# Patient Record
Sex: Male | Born: 1990 | Race: Black or African American | Hispanic: No | Smoking: Never smoker
Health system: Southern US, Community
[De-identification: ages and names within clinical notes are randomized; demographics above are authoritative.]

---

## 2017-02-18 ENCOUNTER — Emergency Department (HOSPITAL_COMMUNITY)
Admission: EM | Admit: 2017-02-18 | Discharge: 2017-02-18 | Disposition: A | Discharge status: Home / Self Care | Payer: Self-pay | Attending: Emergency Medicine | Admitting: Emergency Medicine

## 2017-02-18 ENCOUNTER — Other Ambulatory Visit: Payer: Self-pay

## 2017-02-18 ENCOUNTER — Emergency Department (HOSPITAL_COMMUNITY): Payer: Self-pay

## 2017-02-18 ENCOUNTER — Encounter (HOSPITAL_COMMUNITY): Payer: Self-pay | Admitting: Emergency Medicine

## 2017-02-18 DIAGNOSIS — K279 Peptic ulcer, site unspecified, unspecified as acute or chronic, without hemorrhage or perforation: Secondary | ICD-10-CM | POA: Insufficient documentation

## 2017-02-18 LAB — URINALYSIS, ROUTINE W REFLEX MICROSCOPIC
BILIRUBIN URINE: NEGATIVE
GLUCOSE, UA: NEGATIVE mg/dL
HGB URINE DIPSTICK: NEGATIVE
KETONES UR: NEGATIVE mg/dL
Leukocytes, UA: NEGATIVE
NITRITE: NEGATIVE
PH: 6 (ref 5.0–8.0)
Protein, ur: NEGATIVE mg/dL
SPECIFIC GRAVITY, URINE: 1.013 (ref 1.005–1.030)

## 2017-02-18 LAB — CBC
HCT: 42.2 % (ref 39.0–52.0)
Hemoglobin: 14.5 g/dL (ref 13.0–17.0)
MCH: 26.4 pg (ref 26.0–34.0)
MCHC: 34.4 g/dL (ref 30.0–36.0)
MCV: 76.7 fL — AB (ref 78.0–100.0)
PLATELETS: 209 10*3/uL (ref 150–400)
RBC: 5.5 MIL/uL (ref 4.22–5.81)
RDW: 12.5 % (ref 11.5–15.5)
WBC: 8 10*3/uL (ref 4.0–10.5)

## 2017-02-18 LAB — BASIC METABOLIC PANEL
Anion gap: 8 (ref 5–15)
BUN: 17 mg/dL (ref 6–20)
CALCIUM: 9.5 mg/dL (ref 8.9–10.3)
CO2: 27 mmol/L (ref 22–32)
CREATININE: 1.18 mg/dL (ref 0.61–1.24)
Chloride: 102 mmol/L (ref 101–111)
GFR calc non Af Amer: >60 mL/min (ref >60)
Glucose, Bld: 79 mg/dL (ref 65–99)
Potassium: 4.1 mmol/L (ref 3.5–5.1)
SODIUM: 137 mmol/L (ref 135–145)

## 2017-02-18 LAB — HEPATIC FUNCTION PANEL
ALBUMIN: 4.5 g/dL (ref 3.5–5.0)
ALK PHOS: 61 U/L (ref 38–126)
ALT: 41 U/L (ref 17–63)
AST: 29 U/L (ref 15–41)
BILIRUBIN DIRECT: 0.1 mg/dL (ref 0.1–0.5)
BILIRUBIN INDIRECT: 1.1 mg/dL — AB (ref 0.3–0.9)
Total Bilirubin: 1.2 mg/dL (ref 0.3–1.2)
Total Protein: 6.9 g/dL (ref 6.5–8.1)

## 2017-02-18 LAB — I-STAT TROPONIN, ED: TROPONIN I, POC: 0 ng/mL (ref 0.00–0.08)

## 2017-02-18 LAB — LIPASE, BLOOD: LIPASE: 37 U/L (ref 11–51)

## 2017-02-18 MED ORDER — GI COCKTAIL ~~LOC~~
30.0000 mL | Freq: Once | ORAL | Status: AC
  Administered 2017-02-18: 30 mL via ORAL
  Filled 2017-02-18: qty 30

## 2017-02-18 MED ORDER — SUCRALFATE 1 GM/10ML PO SUSP: 1.0000 g | Freq: Three times a day (TID) | ORAL | 0 refills | Status: DC

## 2017-02-18 MED ORDER — OMEPRAZOLE 20 MG PO CPDR: 20.0000 mg | DELAYED_RELEASE_CAPSULE | Freq: Every day | ORAL | 0 refills | Status: DC

## 2017-02-18 NOTE — ED Triage Notes (Signed)
Patient arrives with complaint of abdominal pain. States onset initially months ago and it was mild, intermittent, and only appeared after sleeping. Over the past 2 weeks he explains that pain has become constant and more intense. Initially pain was only in lower medial area of abdomen. Now pain is diffuse over entire abdomen and extending to mid chest.

## 2017-02-18 NOTE — ED Provider Notes (Signed)
[facility] Provider Note   CSN: [id number] Arrival date & time: 02/18/17  1949     History   Chief Complaint Chief Complaint  Patient presents with  . Abdominal Pain    HPI Mike Madden is a 26 y.o. male.  Patient presents to the ED with a chief complaint of abdominal pain.  He states that he has had the symptoms for several months.  He reports that he has had some epigastric pain, which is worsened with eating.  He reports that it is improved with gas-x and antacids.  He also states that he has been having some constipation.  He states that the symptoms started when he was graduating from college and was under a lot of stress about finding a job.  He denies any fevers, or chills.  There are no other associated symptoms or modifying factors.  He has used a laxative for the constipation with some relief.   The history is provided by the patient. No language interpreter was used.    History reviewed. No pertinent past medical history.  There are no active problems to display for this patient.   History reviewed. No pertinent surgical history.     Home Medications    Prior to Admission medications   Not on File    Family History History reviewed. No pertinent family history.  Social History Social History  Substance Use Topics  . Smoking status: Never Smoker  . Smokeless tobacco: Never Used  . Alcohol use No     Allergies   Patient has no known allergies.   Review of Systems Review of Systems  All other systems reviewed and are negative.    Physical Exam Updated Vital Signs BP 128/84   Pulse 64   Temp (!) 97.5 F (36.4 C) (Oral)   Resp 16   Ht 6\' 2"  (1.88 m)   Wt 77.1 kg (170 lb)   SpO2 100%   BMI 21.83 kg/m   Physical Exam  Constitutional: He is oriented to person, place, and time. He appears well-developed and well-nourished.  HENT:  Head: Normocephalic and atraumatic.  Eyes: Pupils are equal, round, and reactive to light.  Conjunctivae and EOM are normal. Right eye exhibits no discharge. Left eye exhibits no discharge. No scleral icterus.  Neck: Normal range of motion. Neck supple. No JVD present.  Cardiovascular: Normal rate, regular rhythm and normal heart sounds.  Exam reveals no gallop and no friction rub.   No murmur heard. Pulmonary/Chest: Effort normal and breath sounds normal. No respiratory distress. He has no wheezes. He has no rales. He exhibits no tenderness.  Abdominal: Soft. He exhibits no distension and no mass. There is no tenderness. There is no rebound and no guarding.  No focal abdominal tenderness, no RLQ tenderness or pain at McBurney's Madden, no RUQ tenderness or Murphy's sign, no left-sided abdominal tenderness, no fluid wave, or signs of peritonitis   Musculoskeletal: Normal range of motion. He exhibits no edema or tenderness.  Neurological: He is alert and oriented to person, place, and time.  Skin: Skin is warm and dry.  Psychiatric: He has a normal mood and affect. His behavior is normal. Judgment and thought content normal.  Nursing note and vitals reviewed.    ED Treatments / Results  Labs (all labs ordered are listed, but only abnormal results are displayed) Labs Reviewed  CBC - Abnormal; Notable for the following:       Result Value   MCV 76.7 (*)  All other components within normal limits  BASIC METABOLIC PANEL  URINALYSIS, ROUTINE W REFLEX MICROSCOPIC  HEPATIC FUNCTION PANEL  LIPASE, BLOOD  I-STAT TROPONIN, ED    EKG  EKG Interpretation None       Radiology Dg Chest 2 View  Result Date: 02/18/2017 CLINICAL DATA:  Abdominal pain EXAM: CHEST  2 VIEW COMPARISON:  None. FINDINGS: The heart size and mediastinal contours are within normal limits. Both lungs are clear. The visualized skeletal structures are unremarkable. IMPRESSION: No active cardiopulmonary disease. Electronically Signed   By: Jasmine Pang M.D.   On: 02/18/2017 20:56    Procedures Procedures  (including critical care time)  Medications Ordered in ED Medications  gi cocktail (Maalox,Lidocaine,Donnatal) (30 mLs Oral Given 02/18/17 2310)     Initial Impression / Assessment and Plan / ED Course  I have reviewed the triage vital signs and the nursing notes.  Pertinent labs & imaging results that were available during my care of the patient were reviewed by me and considered in my medical decision making (see chart for details).     Patient with epigastric abdominal pain.  Worsened with eating.  Ongoing for several months.  No fever.  Also reports some constipation.   Labs are reassuring.  Symptoms seem consistent with PUD.  I highly doubt surgical abdomen based on story and appearance.  He has not focal abdominal tenderness.  Will treat with omeprazole and carafate.  Recommend GI follow-up.  Other etiologies such as IBS may need to be considered and worked up on outpatient basis.   Patient understands and agrees with the plan.  Final Clinical Impressions(s) / ED Diagnoses   Final diagnoses:  PUD (peptic ulcer disease)    New Prescriptions New Prescriptions   OMEPRAZOLE (PRILOSEC) 20 MG CAPSULE    Take 1 capsule (20 mg total) by mouth daily.   SUCRALFATE (CARAFATE) 1 GM/10ML SUSPENSION    Take 10 mLs (1 g total) by mouth 4 (four) times daily -  with meals and at bedtime.     Roxy Horseman, PA-C 02/18/17 Adin Hector    Blane Ohara, MD 02/19/17 [phone]

## 2017-03-02 ENCOUNTER — Telehealth: Payer: Self-pay | Admitting: Surgery

## 2017-03-02 NOTE — Telephone Encounter (Signed)
ED CM received call from patient concerning changing medications from suspension to tablet form, because he is unable to afford the cost. CM discussed with Dr. Azalia BilisKevin Campos. Prescription called into [facility] on [address] [phone] at patient's request. Patient notified No further questions or concerns.

## 2017-09-06 ENCOUNTER — Other Ambulatory Visit: Payer: Self-pay | Admitting: Physician Assistant

## 2017-09-06 DIAGNOSIS — R1084 Generalized abdominal pain: Secondary | ICD-10-CM

## 2017-09-13 ENCOUNTER — Ambulatory Visit
Admission: RE | Admit: 2017-09-13 | Discharge: 2017-09-13 | Disposition: A | Discharge status: Home / Self Care | Payer: Self-pay | Source: Ambulatory Visit | Attending: Physician Assistant | Admitting: Physician Assistant

## 2017-09-13 DIAGNOSIS — R1084 Generalized abdominal pain: Secondary | ICD-10-CM

## 2017-09-14 ENCOUNTER — Other Ambulatory Visit (HOSPITAL_COMMUNITY): Payer: Self-pay | Admitting: Gastroenterology

## 2017-09-14 DIAGNOSIS — R1012 Left upper quadrant pain: Secondary | ICD-10-CM

## 2017-09-18 ENCOUNTER — Ambulatory Visit (HOSPITAL_COMMUNITY)
Admission: RE | Admit: 2017-09-18 | Discharge: 2017-09-18 | Disposition: A | Discharge status: Home / Self Care | Payer: BC Managed Care – PPO | Source: Ambulatory Visit | Attending: Gastroenterology | Admitting: Gastroenterology

## 2017-09-18 ENCOUNTER — Encounter (HOSPITAL_COMMUNITY): Payer: Self-pay

## 2017-09-18 DIAGNOSIS — R1012 Left upper quadrant pain: Secondary | ICD-10-CM | POA: Insufficient documentation

## 2017-09-18 MED ORDER — IOPAMIDOL (ISOVUE-300) INJECTION 61%
INTRAVENOUS | Status: AC
  Filled 2017-09-18: qty 100

## 2017-09-22 ENCOUNTER — Ambulatory Visit (HOSPITAL_COMMUNITY): Payer: BC Managed Care – PPO

## 2019-02-03 IMAGING — US US ABDOMEN COMPLETE
1 series · 14 of 25 positions shown · non-contrast
Comparison: None.

CLINICAL DATA: Worsening right upper quadrant pain over the past 2
years.

EXAM:
ABDOMEN ULTRASOUND COMPLETE

[Series 1: us abdomen complete · 0.19mm/px · 14 of 91 slices shown]
[im 1/91]
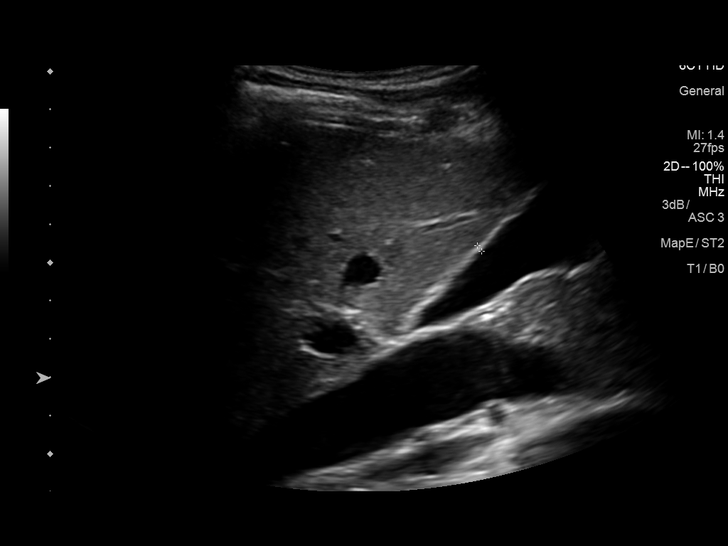
[im 8/91]
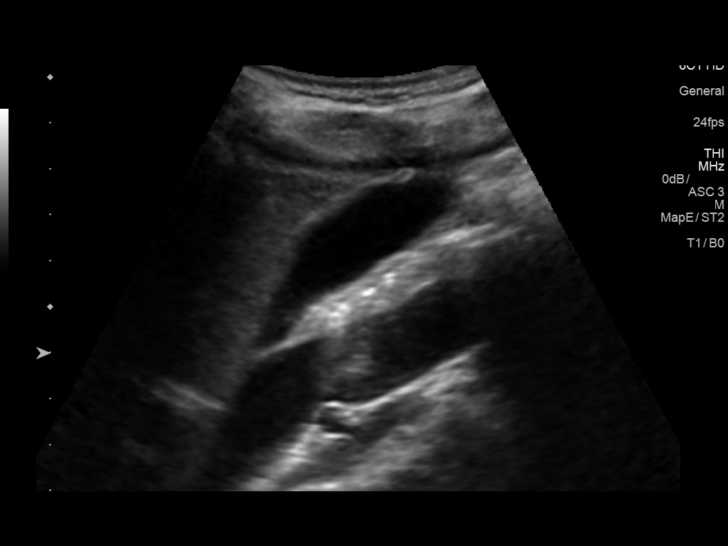
[im 16/91]
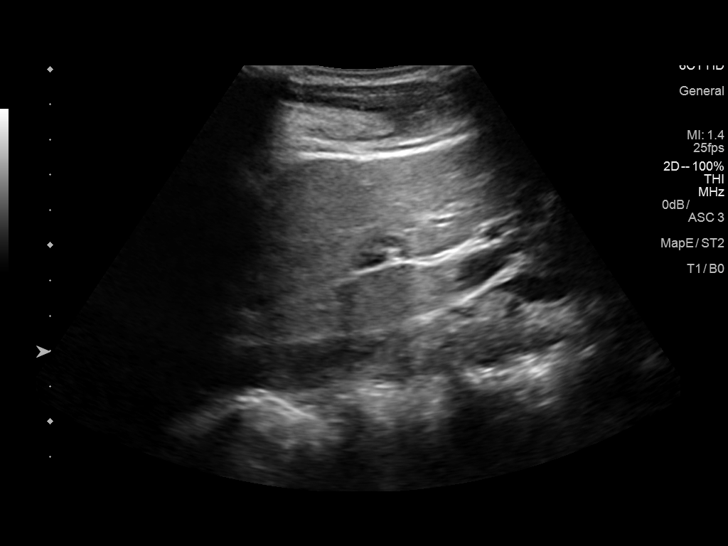
[im 23/91]
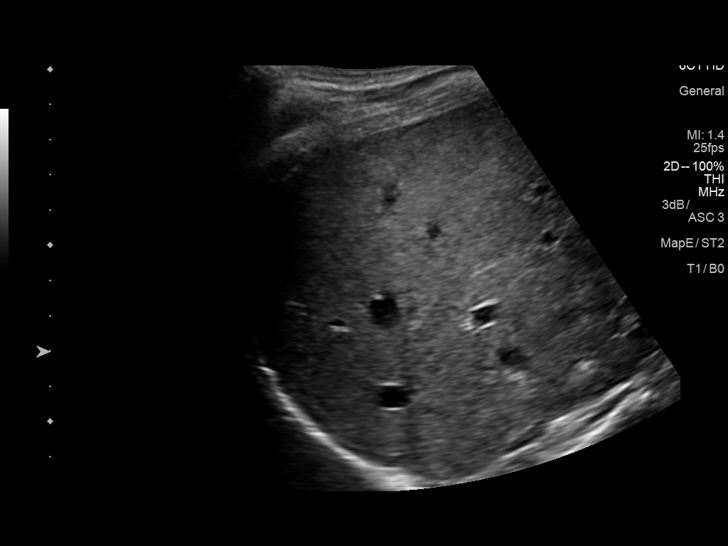
[im 31/91]
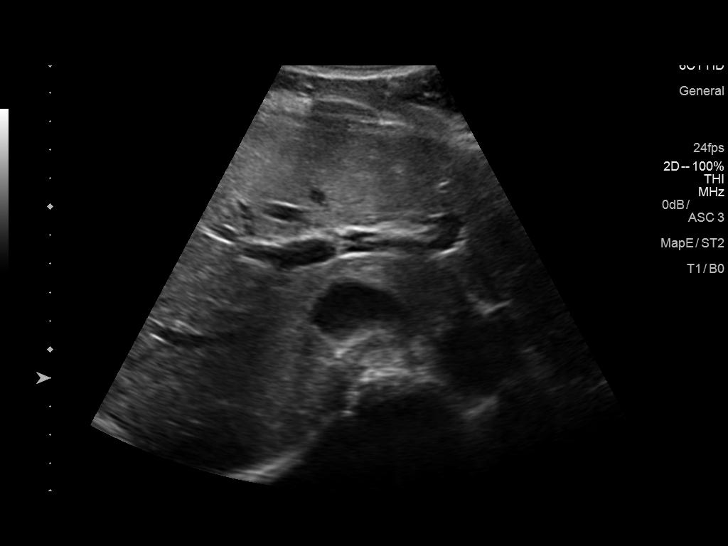
[im 34/91]
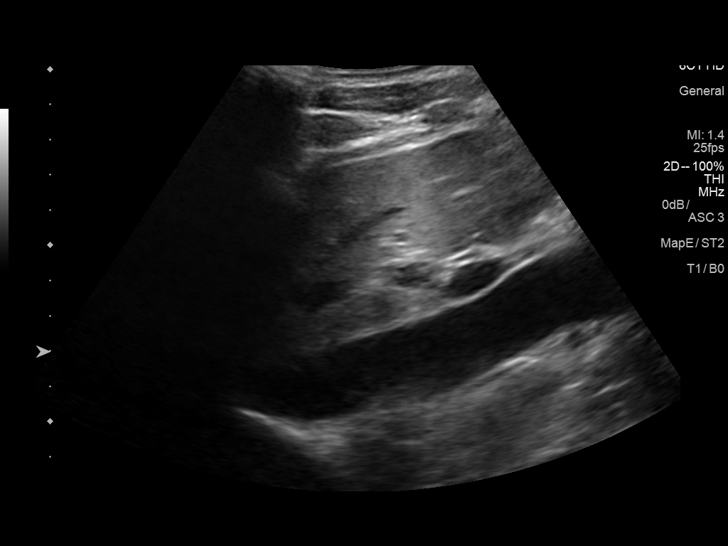
[im 42/91]
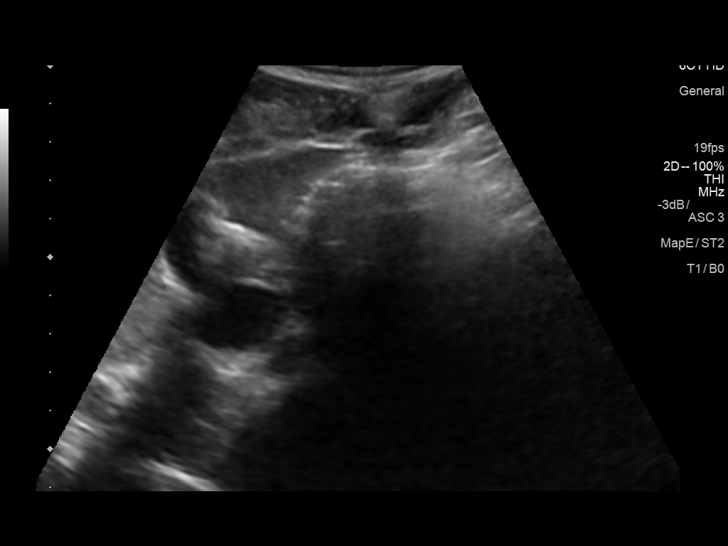
[im 49/91]
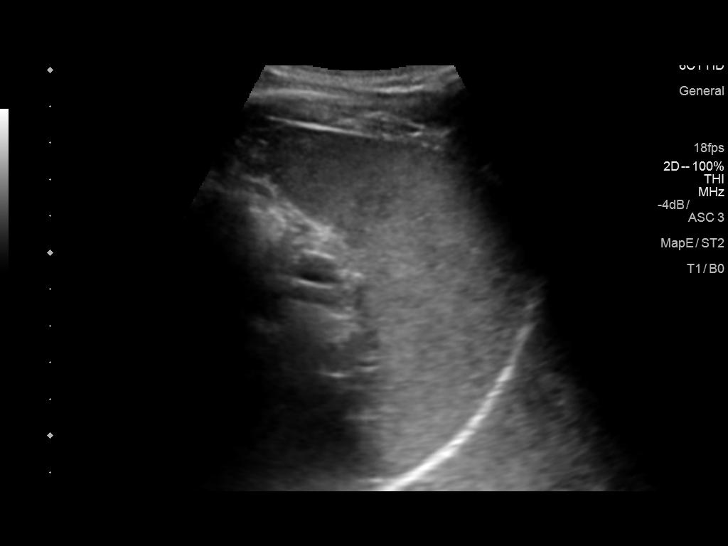
[im 57/91]
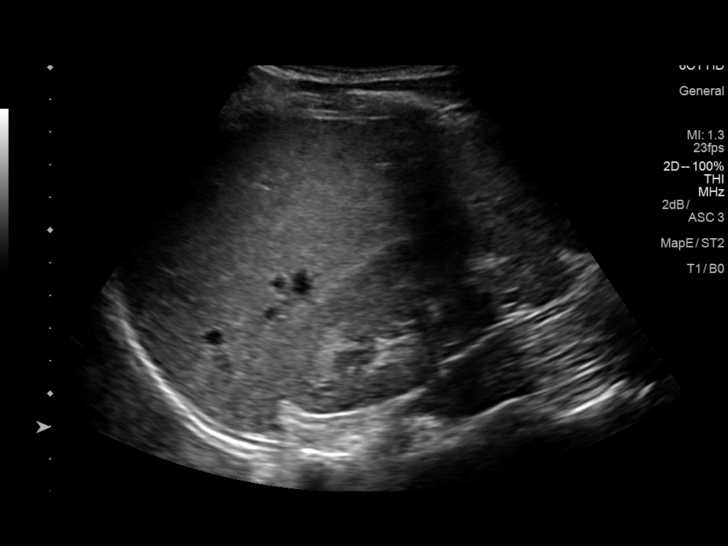
[im 61/91]
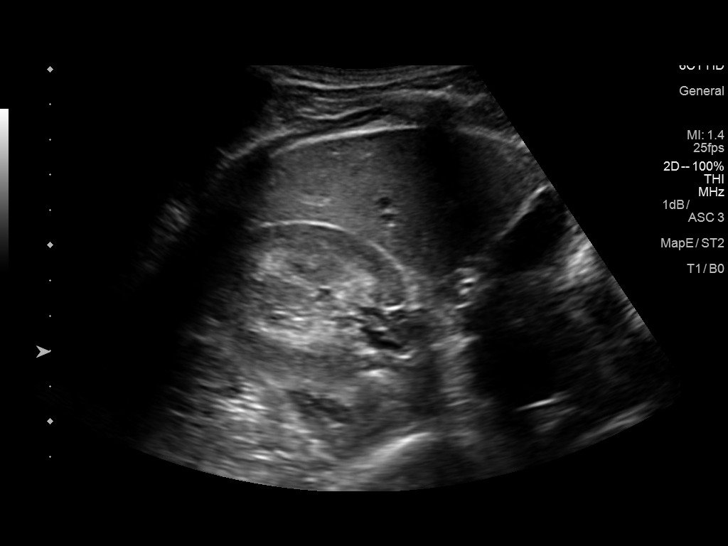
[im 68/91]
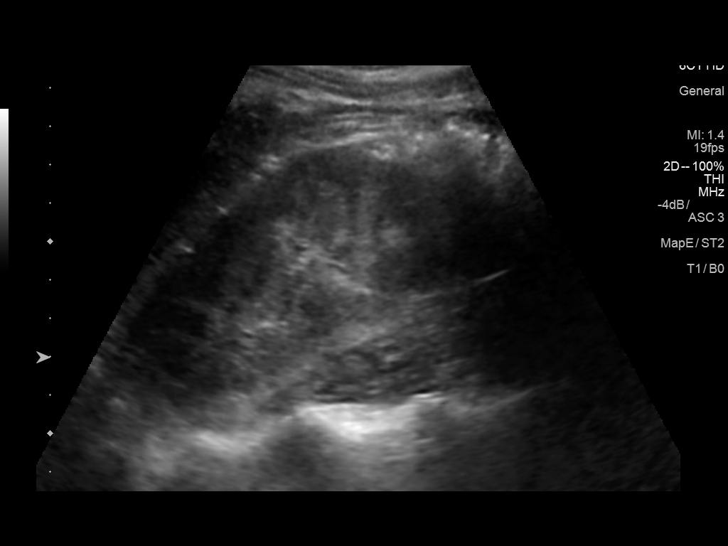
[im 76/91]
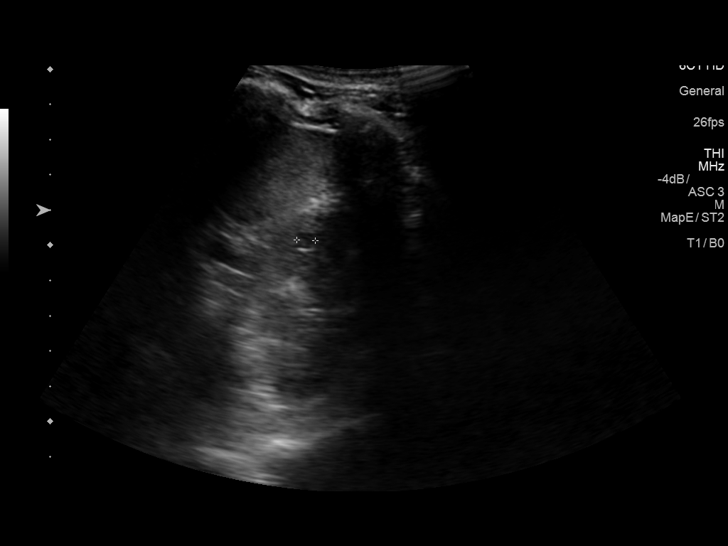
[im 83/91]
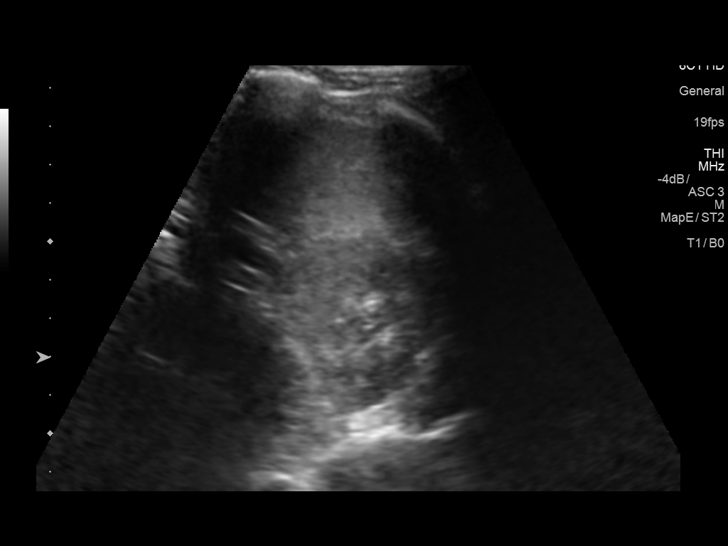
[im 91/91]
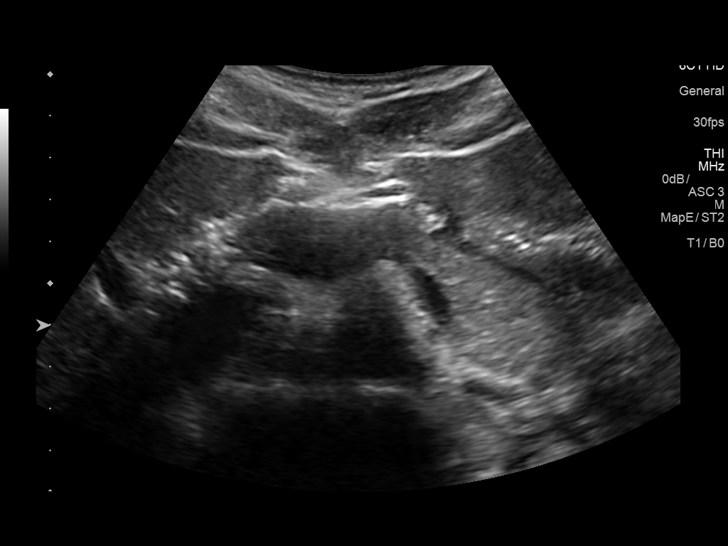

[14 of 25 positions shown; findings below may reference images not displayed]

FINDINGS: Gallbladder: No gallstones or wall thickening visualized. No
sonographic Murphy sign noted by sonographer.

Common bile duct: Diameter: 0.2 cm

Liver: No focal lesion identified. Within normal limits in
parenchymal echogenicity. Portal vein is patent on color Doppler
imaging with normal direction of blood flow towards the liver.

IVC: No abnormality visualized.

Pancreas: Visualized portion unremarkable.

Spleen: Size and appearance within normal limits.

Right Kidney: Length: 10.7 cm. Echogenicity within normal limits. No
mass or hydronephrosis visualized.

Left Kidney: Length: 10.9 cm. Echogenicity within normal limits.
cm cyst left kidney noted. No hydronephrosis visualized.

Abdominal aorta: No aneurysm visualized.

Other findings: None.
IMPRESSION: Normal exam.

## 2020-03-02 ENCOUNTER — Encounter (HOSPITAL_COMMUNITY): Payer: Self-pay | Admitting: *Deleted

## 2020-03-02 ENCOUNTER — Other Ambulatory Visit: Payer: Self-pay

## 2020-03-02 ENCOUNTER — Emergency Department (HOSPITAL_COMMUNITY)
Admission: EM | Admit: 2020-03-02 | Discharge: 2020-03-02 | Disposition: A | Discharge status: Home / Self Care | Payer: BC Managed Care – PPO | Attending: Emergency Medicine | Admitting: Emergency Medicine

## 2020-03-02 DIAGNOSIS — R0602 Shortness of breath: Secondary | ICD-10-CM | POA: Insufficient documentation

## 2020-03-02 DIAGNOSIS — Z5321 Procedure and treatment not carried out due to patient leaving prior to being seen by health care provider: Secondary | ICD-10-CM | POA: Insufficient documentation

## 2020-03-02 DIAGNOSIS — R109 Unspecified abdominal pain: Secondary | ICD-10-CM | POA: Insufficient documentation

## 2020-03-02 LAB — COMPREHENSIVE METABOLIC PANEL
ALT: 40 U/L (ref 0–44)
AST: 24 U/L (ref 15–41)
Albumin: 4.2 g/dL (ref 3.5–5.0)
Alkaline Phosphatase: 55 U/L (ref 38–126)
Anion gap: 8 (ref 5–15)
BUN: 13 mg/dL (ref 6–20)
CO2: 27 mmol/L (ref 22–32)
Calcium: 9.4 mg/dL (ref 8.9–10.3)
Chloride: 103 mmol/L (ref 98–111)
Creatinine, Ser: 1.07 mg/dL (ref 0.61–1.24)
GFR calc Af Amer: >60 mL/min (ref >60)
GFR calc non Af Amer: >60 mL/min (ref >60)
Glucose, Bld: 110 mg/dL — ABNORMAL HIGH (ref 70–99)
Potassium: 3.7 mmol/L (ref 3.5–5.1)
Sodium: 138 mmol/L (ref 135–145)
Total Bilirubin: 1 mg/dL (ref 0.3–1.2)
Total Protein: 6.8 g/dL (ref 6.5–8.1)

## 2020-03-02 LAB — URINALYSIS, ROUTINE W REFLEX MICROSCOPIC
Bilirubin Urine: NEGATIVE
Glucose, UA: NEGATIVE mg/dL
Hgb urine dipstick: NEGATIVE
Ketones, ur: NEGATIVE mg/dL
Leukocytes,Ua: NEGATIVE
Nitrite: NEGATIVE
Protein, ur: NEGATIVE mg/dL
Specific Gravity, Urine: 1.008 (ref 1.005–1.030)
pH: 6 (ref 5.0–8.0)

## 2020-03-02 LAB — CBC
HCT: 46 % (ref 39.0–52.0)
Hemoglobin: 14.9 g/dL (ref 13.0–17.0)
MCH: 25.7 pg — ABNORMAL LOW (ref 26.0–34.0)
MCHC: 32.4 g/dL (ref 30.0–36.0)
MCV: 79.3 fL — ABNORMAL LOW (ref 80.0–100.0)
Platelets: 214 10*3/uL (ref 150–400)
RBC: 5.8 MIL/uL (ref 4.22–5.81)
RDW: 12.7 % (ref 11.5–15.5)
WBC: 9.3 10*3/uL (ref 4.0–10.5)
nRBC: 0 % (ref 0.0–0.2)

## 2020-03-02 LAB — LIPASE, BLOOD: Lipase: 30 U/L (ref 11–51)

## 2020-03-02 MED ORDER — SODIUM CHLORIDE 0.9% FLUSH: 3.0000 mL | Freq: Once | INTRAVENOUS | Status: DC

## 2020-03-02 NOTE — ED Triage Notes (Signed)
The pt is c/o abd pain  For one week he has some sob

## 2020-03-02 NOTE — ED Notes (Signed)
Pt called x 3  No answer. 

## 2020-04-27 ENCOUNTER — Other Ambulatory Visit: Payer: Self-pay

## 2020-04-27 ENCOUNTER — Ambulatory Visit (HOSPITAL_COMMUNITY)
Admission: EM | Admit: 2020-04-27 | Discharge: 2020-04-27 | Disposition: A | Discharge status: Home / Self Care | Payer: HRSA Program | Attending: Family Medicine | Admitting: Family Medicine

## 2020-04-27 ENCOUNTER — Encounter (HOSPITAL_COMMUNITY): Payer: Self-pay | Admitting: Family Medicine

## 2020-04-27 DIAGNOSIS — B349 Viral infection, unspecified: Secondary | ICD-10-CM | POA: Insufficient documentation

## 2020-04-27 DIAGNOSIS — R6883 Chills (without fever): Secondary | ICD-10-CM | POA: Insufficient documentation

## 2020-04-27 DIAGNOSIS — U071 COVID-19: Secondary | ICD-10-CM | POA: Diagnosis not present

## 2020-04-27 DIAGNOSIS — R109 Unspecified abdominal pain: Secondary | ICD-10-CM | POA: Diagnosis not present

## 2020-04-27 DIAGNOSIS — Z79899 Other long term (current) drug therapy: Secondary | ICD-10-CM | POA: Insufficient documentation

## 2020-04-27 DIAGNOSIS — K219 Gastro-esophageal reflux disease without esophagitis: Secondary | ICD-10-CM | POA: Insufficient documentation

## 2020-04-27 MED ORDER — ONDANSETRON 4 MG PO TBDP: 4.0000 mg | ORAL_TABLET | Freq: Three times a day (TID) | ORAL | 0 refills | Status: AC | PRN

## 2020-04-27 MED ORDER — FAMOTIDINE 20 MG PO TABS: 20.0000 mg | ORAL_TABLET | Freq: Two times a day (BID) | ORAL | 0 refills | Status: DC

## 2020-04-27 NOTE — ED Triage Notes (Signed)
PT reports Sx's started on 04-24-20 and have progressed. Pt has Nausea, ABD pain across ABD not just one spot. Pt also has a cough ,chills.

## 2020-04-27 NOTE — Discharge Instructions (Addendum)
Medications as prescribed as needed Liquid mucinex Tylenol as needed Rest, hydrate with Gatorade and Pedialyte.  Chicken and bone broth.  Advance diet as tolerated.  Check my chart for covid results.

## 2020-04-27 NOTE — ED Provider Notes (Signed)
[facility]    CSN: [id number] Arrival date & time: 04/27/20  7902      History   Chief Complaint Chief Complaint  Patient presents with  . Chills  . Abdominal Pain    HPI Mike Madden is a 29 y.o. male.   Patient is I am 29 year old male who presents today with approximately 3 to 4 days of nausea, abdominal pain, loss of appetite, diarrhea, cough, chills, nasal congestion.  Symptoms been constant, waxing waning.  Has been taking Mucinex.  History of GERD.  Denies any chest pain or shortness of breath.  Denies any vomiting.  Best friend recently sick with similar symptoms.  [facility]ab [facility] 2 days ago and has not yet received results.     History reviewed. No pertinent past medical history.  There are no problems to display for this patient.   History reviewed. No pertinent surgical history.     Home Medications    Prior to Admission medications   Medication Sig Start Date End Date Taking? Authorizing Provider  famotidine (PEPCID) 20 MG tablet Take 1 tablet (20 mg total) by mouth 2 (two) times daily. 04/27/20   Dahlia Byes A, NP  ondansetron (ZOFRAN ODT) 4 MG disintegrating tablet Take 1 tablet (4 mg total) by mouth every 8 (eight) hours as needed for nausea or vomiting. 04/27/20   Dahlia Byes A, NP  omeprazole (PRILOSEC) 20 MG capsule Take 1 capsule (20 mg total) by mouth daily. 02/18/17 04/27/20  Roxy Horseman, PA-C  sucralfate (CARAFATE) 1 GM/10ML suspension Take 10 mLs (1 g total) by mouth 4 (four) times daily -  with meals and at bedtime. 02/18/17 04/27/20  Roxy Horseman, PA-C    Family History History reviewed. No pertinent family history.  Social History Social History   Tobacco Use  . Smoking status: Never Smoker  . Smokeless tobacco: Never Used  Substance Use Topics  . Alcohol use: No  . Drug use: No     Allergies   Patient has no known allergies.   Review of Systems Review of Systems   Physical  Exam Triage Vital Signs ED Triage Vitals  Enc Vitals Group     BP 04/27/20 1140 (!) 141/104     Pulse Rate 04/27/20 1140 84     Resp 04/27/20 1140 15     Temp 04/27/20 1140 98.6 F (37 C)     Temp Source 04/27/20 1140 Oral     SpO2 04/27/20 1140 100 %     Weight 04/27/20 1143 155 lb (70.3 kg)     Height 04/27/20 1143 6\' 2"  (1.88 m)     Head Circumference --      Peak Flow --      Pain Score 04/27/20 1142 8     Pain Loc --      Pain Edu? --      Excl. in GC? --    No data found.  Updated Vital Signs BP (!) 141/104 (BP Location: Right Arm)   Pulse 84   Temp 98.6 F (37 C) (Oral)   Resp 15   Ht 6\' 2"  (1.88 m)   Wt 155 lb (70.3 kg)   SpO2 100%   BMI 19.90 kg/m   Visual Acuity Right Eye Distance:   Left Eye Distance:   Bilateral Distance:    Right Eye Near:   Left Eye Near:    Bilateral Near:     Physical Exam Vitals and nursing note  reviewed.  Constitutional:      General: He is not in acute distress.    Appearance: Normal appearance. He is not ill-appearing, toxic-appearing or diaphoretic.  HENT:     Head: Normocephalic and atraumatic.     Nose: Congestion present.  Eyes:     Conjunctiva/sclera: Conjunctivae normal.  Cardiovascular:     Rate and Rhythm: Normal rate.  Pulmonary:     Effort: Pulmonary effort is normal.     Breath sounds: Normal breath sounds.  Abdominal:     Palpations: Abdomen is soft.     Tenderness: There is abdominal tenderness.     Comments: Generalized upper abdominal discomfort without guarding or rebound.  Musculoskeletal:        General: Normal range of motion.     Cervical back: Normal range of motion.  Skin:    General: Skin is warm and dry.  Neurological:     Mental Status: He is alert.  Psychiatric:        Mood and Affect: Mood normal.      UC Treatments / Results  Labs (all labs ordered are listed, but only abnormal results are displayed) Labs Reviewed  SARS CORONAVIRUS 2 (TAT 6-24 HRS)     EKG   Radiology No results found.  Procedures Procedures (including critical care time)  Medications Ordered in UC Medications - No data to display  Initial Impression / Assessment and Plan / UC Course  I have reviewed the triage vital signs and the nursing notes.  Pertinent labs & imaging results that were available during my care of the patient were reviewed by me and considered in my medical decision making (see chart for details).     Viral illness High concern for Covid at this time.  Covid swab pending. Recommended over-the-counter medicines for symptoms.  Zofran as needed for nausea, vomiting. Bland diet and advance diet as tolerated. Hydrate with Gatorade and Pedialyte Tylenol as needed Follow up as needed for continued or worsening symptoms  Final Clinical Impressions(s) / UC Diagnoses   Final diagnoses:  Viral illness     Discharge Instructions     Medications as prescribed as needed Liquid mucinex Tylenol as needed Rest, hydrate with Gatorade and Pedialyte.  Chicken and bone broth.  Advance diet as tolerated.  Check my chart for covid results.      ED Prescriptions    Medication Sig Dispense Auth. Provider   famotidine (PEPCID) 20 MG tablet Take 1 tablet (20 mg total) by mouth 2 (two) times daily. 30 tablet Terin Dierolf A, NP   ondansetron (ZOFRAN ODT) 4 MG disintegrating tablet Take 1 tablet (4 mg total) by mouth every 8 (eight) hours as needed for nausea or vomiting. 20 tablet Dahlia Byes A, NP     PDMP not reviewed this encounter.   Dahlia Byes A, NP 04/27/20 1223

## 2020-04-28 LAB — SARS CORONAVIRUS 2 (TAT 6-24 HRS): SARS Coronavirus 2: POSITIVE — AB

## 2020-04-29 ENCOUNTER — Telehealth: Payer: Self-pay | Admitting: Family

## 2020-04-29 ENCOUNTER — Encounter (HOSPITAL_COMMUNITY): Payer: Self-pay

## 2020-04-29 ENCOUNTER — Ambulatory Visit (HOSPITAL_COMMUNITY)
Admission: EM | Admit: 2020-04-29 | Discharge: 2020-04-29 | Disposition: A | Discharge status: Home / Self Care | Payer: HRSA Program

## 2020-04-29 ENCOUNTER — Emergency Department (HOSPITAL_COMMUNITY): Payer: HRSA Program

## 2020-04-29 ENCOUNTER — Encounter (HOSPITAL_COMMUNITY): Payer: Self-pay | Admitting: Emergency Medicine

## 2020-04-29 ENCOUNTER — Emergency Department (HOSPITAL_COMMUNITY)
Admission: EM | Admit: 2020-04-29 | Discharge: 2020-04-29 | Disposition: A | Discharge status: Home / Self Care | Payer: HRSA Program | Attending: Emergency Medicine | Admitting: Emergency Medicine

## 2020-04-29 ENCOUNTER — Other Ambulatory Visit: Payer: Self-pay

## 2020-04-29 DIAGNOSIS — U071 COVID-19: Secondary | ICD-10-CM | POA: Diagnosis not present

## 2020-04-29 DIAGNOSIS — R042 Hemoptysis: Secondary | ICD-10-CM

## 2020-04-29 DIAGNOSIS — Z5321 Procedure and treatment not carried out due to patient leaving prior to being seen by health care provider: Secondary | ICD-10-CM | POA: Insufficient documentation

## 2020-04-29 NOTE — ED Notes (Signed)
Called for Pt x3. No answer. Moving Pt OTF

## 2020-04-29 NOTE — Telephone Encounter (Signed)
Called to Discuss with patient about Covid symptoms and the use of the monoclonal antibody infusion for those with mild to moderate Covid symptoms and at a high risk of hospitalization.     Pt appears to qualify for this infusion due to co-morbid conditions and/or a member of an at-risk group in accordance with the FDA Emergency Use Authorization.    Mike Madden was seen in the [facility] earlier today with concern for coughing up blood and has also had nausea and diarrhea. Qualifying risk factors include increased SVI. Symptom onset was 9/21. Currently experiencing body aches, congestion, stomach aches.   Discussed the risks and benefits of treatment with Regeneron and he wishes to think about it and discuss it with his girlfriend. Informed him that the closer to 10 days the less benefit. Additional information sent to MyChart.   Marcos Eke, NP 04/29/2020 4:39 PM

## 2020-04-29 NOTE — ED Provider Notes (Signed)
[facility]    CSN: [id number] Arrival date & time: 04/29/20  1033      History   Chief Complaint Chief Complaint  Patient presents with  . Covid +    HPI Mike Madden is a 29 y.o. male.   Mike Madden presents with complaints of episode last night of blood to mucus with cough. Tested positive for covid-19 on 9/27. Symptoms started 9/21, had backache. 9/24 really worsened. Has been feeling better overall. History of "stomach problems," and illness tends to exacerbate this- nausea vomiting diarrhea. More diarrhea than nausea. Has been taking fluids and bland diet. He has felt some chest tightness, this is not new for him anyways. Coughed last night and noted bright red blood to mucus. He went to the ER last night after calling the covid help line. Due to wait time he left and went home. Had a chest xray before leaving, which was normal. No blood to mucus production since. He has a rare cough. Still a lot of nasal drainage. Has been using nasal spray. No chest pain . No shortness of breath . Doesn't feel worse, still feels he is overall improving.    ROS per HPI, negative if not otherwise mentioned.      History reviewed. No pertinent past medical history.  There are no problems to display for this patient.   History reviewed. No pertinent surgical history.     Home Medications    Prior to Admission medications   Medication Sig Start Date End Date Taking? Authorizing Provider  famotidine (PEPCID) 20 MG tablet Take 1 tablet (20 mg total) by mouth 2 (two) times daily. 04/27/20   Dahlia Byes A, NP  ondansetron (ZOFRAN ODT) 4 MG disintegrating tablet Take 1 tablet (4 mg total) by mouth every 8 (eight) hours as needed for nausea or vomiting. 04/27/20   Dahlia Byes A, NP  omeprazole (PRILOSEC) 20 MG capsule Take 1 capsule (20 mg total) by mouth daily. 02/18/17 04/27/20  Roxy Horseman, PA-C  sucralfate (CARAFATE) 1 GM/10ML suspension Take 10 mLs (1 g total) by  mouth 4 (four) times daily -  with meals and at bedtime. 02/18/17 04/27/20  Roxy Horseman, PA-C    Family History Family History  Family history unknown: Yes    Social History Social History   Tobacco Use  . Smoking status: Never Smoker  . Smokeless tobacco: Never Used  Substance Use Topics  . Alcohol use: No  . Drug use: No     Allergies   Patient has no known allergies.   Review of Systems Review of Systems   Physical Exam Triage Vital Signs ED Triage Vitals  Enc Vitals Group     BP 04/29/20 1241 125/82     Pulse Rate 04/29/20 1241 62     Resp 04/29/20 1241 17     Temp 04/29/20 1241 98 F (36.7 C)     Temp Source 04/29/20 1241 Oral     SpO2 04/29/20 1241 100 %     Weight --      Height --      Head Circumference --      Peak Flow --      Pain Score 04/29/20 1236 2     Pain Loc --      Pain Edu? --      Excl. in GC? --    No data found.  Updated Vital Signs BP 125/82 (BP Location: Right Arm)   Pulse 62  Temp 98 F (36.7 C) (Oral)   Resp 17   SpO2 100%   Visual Acuity Right Eye Distance:   Left Eye Distance:   Bilateral Distance:    Right Eye Near:   Left Eye Near:    Bilateral Near:     Physical Exam Constitutional:      Appearance: He is well-developed.  Cardiovascular:     Rate and Rhythm: Normal rate.  Pulmonary:     Effort: Pulmonary effort is normal.  Skin:    General: Skin is warm and dry.  Neurological:     Mental Status: He is alert and oriented to person, place, and time.      UC Treatments / Results  Labs (all labs ordered are listed, but only abnormal results are displayed) Labs Reviewed - No data to display  EKG   Radiology DG Chest Portable 1 View  Result Date: 04/29/2020 CLINICAL DATA:  COVID positive.  Productive cough and hemoptysis. EXAM: PORTABLE CHEST 1 VIEW COMPARISON:  02/18/2017 FINDINGS: The heart size and mediastinal contours are within normal limits. Both lungs are clear. The visualized skeletal  structures are unremarkable. IMPRESSION: No active disease. Electronically Signed   By: Burman Nieves M.D.   On: 04/29/2020 00:37    Procedures Procedures (including critical care time)  Medications Ordered in UC Medications - No data to display  Initial Impression / Assessment and Plan / UC Course  I have reviewed the triage vital signs and the nursing notes.  Pertinent labs & imaging results that were available during my care of the patient were reviewed by me and considered in my medical decision making (see chart for details).     Chest xray reviewed from visit last night, no acute findings. No chest pain , no shortness of breath , minimal cough. Significant nasal drainage, using nasal spray. I suspect this is source of bleeding, or even from back of throat. Nasal saline recommended. Return precautions provided. Patient verbalized understanding and agreeable to plan.   Final Clinical Impressions(s) / UC Diagnoses   Final diagnoses:  Coughing up blood  COVID-19 virus infection     Discharge Instructions     Your xray last night was normal. Your vital signs are normal.  I suspect the blood you saw is related to upper airways such as nose and throat.  You may use over the counter nasal saline to moisturize your nose which may help.  If symptoms worsen or do not improve in the next week to return to be seen or to follow up with your PCP.      ED Prescriptions    None     PDMP not reviewed this encounter.   Georgetta Haber, NP 04/29/20 1411

## 2020-04-29 NOTE — Discharge Instructions (Addendum)
Your xray last night was normal. Your vital signs are normal.  I suspect the blood you saw is related to upper airways such as nose and throat.  You may use over the counter nasal saline to moisturize your nose which may help.  If symptoms worsen or do not improve in the next week to return to be seen or to follow up with your PCP.

## 2020-04-29 NOTE — ED Triage Notes (Signed)
Patient tested positive for Covid19 this week reports productive cough with bloody phlegm this evening . No fever or chills/respirations unlabored.

## 2020-04-29 NOTE — ED Triage Notes (Signed)
Pt presents with productive cough with bloody phlegm last night.   Pt was seen in ED last night and had chest xray done that was within normal range.

## 2020-05-04 ENCOUNTER — Other Ambulatory Visit: Payer: Self-pay

## 2020-05-04 ENCOUNTER — Ambulatory Visit (HOSPITAL_COMMUNITY)
Admission: RE | Admit: 2020-05-04 | Discharge: 2020-05-04 | Disposition: A | Discharge status: Home / Self Care | Payer: HRSA Program | Source: Ambulatory Visit | Attending: Family Medicine | Admitting: Family Medicine

## 2020-05-04 ENCOUNTER — Encounter (HOSPITAL_COMMUNITY): Payer: Self-pay

## 2020-05-04 VITALS — BP 127/85 | HR 62 | Temp 98.1°F | Resp 18

## 2020-05-04 DIAGNOSIS — U071 COVID-19: Secondary | ICD-10-CM

## 2020-05-04 NOTE — ED Triage Notes (Signed)
Pt presents with nasal congestion and abdominal pain x 10 days. Denies fever, sob, cough or any other symptoms.   Pt reports he had a COVID test at [facility] on the 04/24/20 and he did not got the results on time he came to this UC for COVID test was positive on 04/27/20

## 2020-05-04 NOTE — ED Provider Notes (Signed)
[facility]    CSN: [id number] Arrival date & time: 05/04/20  1351      History   Chief Complaint Chief Complaint  Patient presents with  . Appointment    1400  . Nasal Congestion  . Abdominal Pain    HPI Mike Madden is a 29 y.o. male.   Presenting today following up on his health status as he is recovering from COVID 19 infection diagnosed 04/27/20. States overall he's feeling much better now, mainly only having some mild nasal congestion and LUQ abdominal pain. Denies recent fever, cough, N/V/D, CP, SOB. Currently taking mucinex and doing nasal sprays, sinus rinses which he does feel is helping. Tolerating PO well, good energy levels.      History reviewed. No pertinent past medical history.  There are no problems to display for this patient.   History reviewed. No pertinent surgical history.     Home Medications    Prior to Admission medications   Medication Sig Start Date End Date Taking? Authorizing Provider  famotidine (PEPCID) 20 MG tablet Take 1 tablet (20 mg total) by mouth 2 (two) times daily. 04/27/20   Dahlia Byes A, NP  ondansetron (ZOFRAN ODT) 4 MG disintegrating tablet Take 1 tablet (4 mg total) by mouth every 8 (eight) hours as needed for nausea or vomiting. 04/27/20   Dahlia Byes A, NP  omeprazole (PRILOSEC) 20 MG capsule Take 1 capsule (20 mg total) by mouth daily. 02/18/17 04/27/20  Roxy Horseman, PA-C  sucralfate (CARAFATE) 1 GM/10ML suspension Take 10 mLs (1 g total) by mouth 4 (four) times daily -  with meals and at bedtime. 02/18/17 04/27/20  Roxy Horseman, PA-C    Family History Family History  Family history unknown: Yes    Social History Social History   Tobacco Use  . Smoking status: Never Smoker  . Smokeless tobacco: Never Used  Substance Use Topics  . Alcohol use: No  . Drug use: No     Allergies   Patient has no known allergies.   Review of Systems Review of Systems PER HPI   Physical Exam Triage  Vital Signs ED Triage Vitals  Enc Vitals Group     BP 05/04/20 1504 127/85     Pulse Rate 05/04/20 1504 62     Resp 05/04/20 1504 18     Temp 05/04/20 1504 98.1 F (36.7 C)     Temp Source 05/04/20 1504 Oral     SpO2 05/04/20 1504 100 %     Weight --      Height --      Head Circumference --      Peak Flow --      Pain Score 05/04/20 1503 4     Pain Loc --      Pain Edu? --      Excl. in GC? --    No data found.  Updated Vital Signs BP 127/85 (BP Location: Right Arm)   Pulse 62   Temp 98.1 F (36.7 C) (Oral)   Resp 18   SpO2 100%   Visual Acuity Right Eye Distance:   Left Eye Distance:   Bilateral Distance:    Right Eye Near:   Left Eye Near:    Bilateral Near:     Physical Exam Vitals and nursing note reviewed.  Constitutional:      Appearance: Normal appearance. He is well-developed.  HENT:     Head: Atraumatic.     Right Ear: Tympanic membrane normal.  Left Ear: Tympanic membrane normal.     Nose: Nose normal.     Comments: Mild erythema of nasal mucosa b/l    Mouth/Throat:     Mouth: Mucous membranes are moist.     Pharynx: Oropharynx is clear.  Eyes:     Extraocular Movements: Extraocular movements intact.     Conjunctiva/sclera: Conjunctivae normal.  Cardiovascular:     Rate and Rhythm: Normal rate and regular rhythm.  Pulmonary:     Effort: Pulmonary effort is normal.     Breath sounds: Normal breath sounds. No wheezing or rales.  Abdominal:     General: Bowel sounds are normal. There is no distension.     Palpations: Abdomen is soft.     Tenderness: There is no abdominal tenderness. There is no guarding.  Musculoskeletal:        General: Normal range of motion.     Cervical back: Normal range of motion and neck supple.  Skin:    General: Skin is warm and dry.  Neurological:     General: No focal deficit present.     Mental Status: He is alert and oriented to person, place, and time.  Psychiatric:        Mood and Affect: Mood normal.         Thought Content: Thought content normal.        Judgment: Judgment normal.    UC Treatments / Results  Labs (all labs ordered are listed, but only abnormal results are displayed) Labs Reviewed - No data to display  EKG   Radiology No results found.  Procedures Procedures (including critical care time)  Medications Ordered in UC Medications - No data to display  Initial Impression / Assessment and Plan / UC Course  I have reviewed the triage vital signs and the nursing notes.  Pertinent labs & imaging results that were available during my care of the patient were reviewed by me and considered in my medical decision making (see chart for details).     COVID 19 infection Resolving without apparent complication. Vitals and exam reassuring today, and he is well appearing. Continue current OTC regimen until all sxs resolved, advance activities slowly as tolerated. F/u with worsening sxs or concerns.   Final Clinical Impressions(s) / UC Diagnoses   Final diagnoses:  COVID-19   Discharge Instructions   None    ED Prescriptions    None     PDMP not reviewed this encounter.   Particia Nearing, [place] 05/04/20 1541

## 2020-05-11 ENCOUNTER — Emergency Department (HOSPITAL_COMMUNITY): Payer: Self-pay

## 2020-05-11 ENCOUNTER — Emergency Department (HOSPITAL_COMMUNITY)
Admission: EM | Admit: 2020-05-11 | Discharge: 2020-05-11 | Disposition: A | Discharge status: Home / Self Care | Payer: Self-pay | Attending: Emergency Medicine | Admitting: Emergency Medicine

## 2020-05-11 ENCOUNTER — Other Ambulatory Visit: Payer: Self-pay

## 2020-05-11 DIAGNOSIS — M25512 Pain in left shoulder: Secondary | ICD-10-CM | POA: Insufficient documentation

## 2020-05-11 DIAGNOSIS — R079 Chest pain, unspecified: Secondary | ICD-10-CM | POA: Insufficient documentation

## 2020-05-11 DIAGNOSIS — Z5321 Procedure and treatment not carried out due to patient leaving prior to being seen by health care provider: Secondary | ICD-10-CM | POA: Insufficient documentation

## 2020-05-11 DIAGNOSIS — K5900 Constipation, unspecified: Secondary | ICD-10-CM | POA: Insufficient documentation

## 2020-05-11 DIAGNOSIS — Z8616 Personal history of covid-19: Secondary | ICD-10-CM | POA: Diagnosis not present

## 2020-05-11 LAB — BASIC METABOLIC PANEL
Anion gap: 11 (ref 5–15)
BUN: 11 mg/dL (ref 6–20)
CO2: 25 mmol/L (ref 22–32)
Calcium: 9.7 mg/dL (ref 8.9–10.3)
Chloride: 102 mmol/L (ref 98–111)
Creatinine, Ser: 1.04 mg/dL (ref 0.61–1.24)
GFR, Estimated: >60 mL/min (ref >60)
Glucose, Bld: 96 mg/dL (ref 70–99)
Potassium: 4.1 mmol/L (ref 3.5–5.1)
Sodium: 138 mmol/L (ref 135–145)

## 2020-05-11 LAB — CBC
HCT: 47.8 % (ref 39.0–52.0)
Hemoglobin: 15.4 g/dL (ref 13.0–17.0)
MCH: 25.5 pg — ABNORMAL LOW (ref 26.0–34.0)
MCHC: 32.2 g/dL (ref 30.0–36.0)
MCV: 79.3 fL — ABNORMAL LOW (ref 80.0–100.0)
Platelets: 274 10*3/uL (ref 150–400)
RBC: 6.03 MIL/uL — ABNORMAL HIGH (ref 4.22–5.81)
RDW: 12.1 % (ref 11.5–15.5)
WBC: 9.2 10*3/uL (ref 4.0–10.5)
nRBC: 0 % (ref 0.0–0.2)

## 2020-05-11 LAB — TROPONIN I (HIGH SENSITIVITY): Troponin I (High Sensitivity): 3 ng/L (ref <18)

## 2020-05-11 NOTE — ED Notes (Signed)
Pt was encouraged to stay. Pt said he will make appointment with doctor. Pt said he no longer wanted wait.

## 2020-05-11 NOTE — ED Triage Notes (Signed)
Pt arrives to ED with C/O of chest pain left shoulder pain and constipation ongoing for 2 weeks, Pt did tested + for covid on 9/27. Pt denies any fever cough or chest pain. Pt mostly concerned with constipation but did have hard BM yesterday.

## 2020-05-12 ENCOUNTER — Other Ambulatory Visit: Payer: Self-pay

## 2020-05-12 ENCOUNTER — Emergency Department (HOSPITAL_COMMUNITY)
Admission: EM | Admit: 2020-05-12 | Discharge: 2020-05-12 | Disposition: A | Discharge status: Home / Self Care | Payer: Self-pay | Attending: Emergency Medicine | Admitting: Emergency Medicine

## 2020-05-12 ENCOUNTER — Ambulatory Visit: Payer: Self-pay

## 2020-05-12 DIAGNOSIS — K2900 Acute gastritis without bleeding: Secondary | ICD-10-CM

## 2020-05-12 DIAGNOSIS — Z8616 Personal history of COVID-19: Secondary | ICD-10-CM | POA: Insufficient documentation

## 2020-05-12 DIAGNOSIS — K2970 Gastritis, unspecified, without bleeding: Secondary | ICD-10-CM | POA: Insufficient documentation

## 2020-05-12 DIAGNOSIS — K5901 Slow transit constipation: Secondary | ICD-10-CM | POA: Insufficient documentation

## 2020-05-12 LAB — URINALYSIS, ROUTINE W REFLEX MICROSCOPIC
Bilirubin Urine: NEGATIVE
Glucose, UA: NEGATIVE mg/dL
Hgb urine dipstick: NEGATIVE
Ketones, ur: NEGATIVE mg/dL
Leukocytes,Ua: NEGATIVE
Nitrite: NEGATIVE
Protein, ur: NEGATIVE mg/dL
Specific Gravity, Urine: 1.002 — ABNORMAL LOW (ref 1.005–1.030)
pH: 7 (ref 5.0–8.0)

## 2020-05-12 LAB — HEPATIC FUNCTION PANEL
ALT: 25 U/L (ref 0–44)
AST: 15 U/L (ref 15–41)
Albumin: 4.3 g/dL (ref 3.5–5.0)
Alkaline Phosphatase: 58 U/L (ref 38–126)
Bilirubin, Direct: 0.1 mg/dL (ref 0.0–0.2)
Indirect Bilirubin: 0.4 mg/dL (ref 0.3–0.9)
Total Bilirubin: 0.5 mg/dL (ref 0.3–1.2)
Total Protein: 7.2 g/dL (ref 6.5–8.1)

## 2020-05-12 LAB — TROPONIN I (HIGH SENSITIVITY): Troponin I (High Sensitivity): 4 ng/L (ref <18)

## 2020-05-12 LAB — LIPASE, BLOOD: Lipase: 45 U/L (ref 11–51)

## 2020-05-12 MED ORDER — FAMOTIDINE IN NACL 20-0.9 MG/50ML-% IV SOLN
20.0000 mg | Freq: Once | INTRAVENOUS | Status: AC
  Administered 2020-05-12: 20 mg via INTRAVENOUS
  Filled 2020-05-12: qty 50

## 2020-05-12 MED ORDER — FAMOTIDINE 20 MG PO TABS: 20.0000 mg | ORAL_TABLET | Freq: Two times a day (BID) | ORAL | 0 refills | Status: AC

## 2020-05-12 MED ORDER — MAGNESIUM CITRATE PO SOLN: 1.0000 | Freq: Once | ORAL | 0 refills | Status: AC

## 2020-05-12 MED ORDER — DICYCLOMINE HCL 20 MG PO TABS: 20.0000 mg | ORAL_TABLET | Freq: Two times a day (BID) | ORAL | 0 refills | Status: AC

## 2020-05-12 MED ORDER — BISACODYL 10 MG RE SUPP: 10.0000 mg | RECTAL | 0 refills | Status: AC | PRN

## 2020-05-12 MED ORDER — SODIUM CHLORIDE 0.9 % IV BOLUS
1000.0000 mL | Freq: Once | INTRAVENOUS | Status: AC
  Administered 2020-05-12: 1000 mL via INTRAVENOUS

## 2020-05-12 NOTE — ED Provider Notes (Signed)
[facility] EMERGENCY DEPARTMENT Provider Note   CSN: [id number] Arrival date & time: 05/12/20  0113     History Chief Complaint  Patient presents with  . Abdominal Pain    Mike Madden is a 29 y.o. male who presents to ED with a chief complaint of upper abdominal pain.  For the past 3 weeks since he was diagnosed with Covid, has been having abdominal discomfort.  However last night after he ate a bowl of Cheerios, his pain got worse.  Has been constipated for the past 3 days.  Reports nausea but denies any vomiting.  He denies any urinary symptoms.  States that the pain is located under both of his lower ribs and will sometimes radiate up into the back.  He does not feel like this is chest pain.  She denies any shortness of breath although does have trouble catching his breath when the pain worsens.  Denies any leg swelling.  No prior abdominal surgeries.  No fevers.  He feels like he otherwise recovered fully from his Covid infection.  Denies any bloody stools or vomiting, prior abdominal surgeries.  No significant relief noted with MiraLAX.  HPI     No past medical history on file.  There are no problems to display for this patient.   No past surgical history on file.     Family History  Family history unknown: Yes    Social History   Tobacco Use  . Smoking status: Never Smoker  . Smokeless tobacco: Never Used  Substance Use Topics  . Alcohol use: No  . Drug use: No    Home Medications Prior to Admission medications   Medication Sig Start Date End Date Taking? Authorizing Provider  bisacodyl (DULCOLAX) 10 MG suppository Place 1 suppository (10 mg total) rectally as needed for moderate constipation. 05/12/20   Maxen Rowland, PA-C  dicyclomine (BENTYL) 20 MG tablet Take 1 tablet (20 mg total) by mouth 2 (two) times daily. 05/12/20   Mikka Kissner, PA-C  famotidine (PEPCID) 20 MG tablet Take 1 tablet (20 mg total) by mouth 2 (two) times daily.  05/12/20   Atanacio Melnyk, PA-C  magnesium citrate SOLN Take 296 mLs (1 Bottle total) by mouth once for 1 dose. 05/12/20 05/12/20  Nakoa Ganus, PA-C  ondansetron (ZOFRAN ODT) 4 MG disintegrating tablet Take 1 tablet (4 mg total) by mouth every 8 (eight) hours as needed for nausea or vomiting. 04/27/20   Dahlia Byes A, NP  omeprazole (PRILOSEC) 20 MG capsule Take 1 capsule (20 mg total) by mouth daily. 02/18/17 04/27/20  Roxy Horseman, PA-C  sucralfate (CARAFATE) 1 GM/10ML suspension Take 10 mLs (1 g total) by mouth 4 (four) times daily -  with meals and at bedtime. 02/18/17 04/27/20  Roxy Horseman, PA-C    Allergies    Patient has no known allergies.  Review of Systems   Review of Systems  Constitutional: Negative for appetite change, chills and fever.  HENT: Negative for ear pain, rhinorrhea, sneezing and sore throat.   Eyes: Negative for photophobia and visual disturbance.  Respiratory: Negative for cough, chest tightness, shortness of breath and wheezing.   Cardiovascular: Negative for chest pain and palpitations.  Gastrointestinal: Positive for abdominal pain, constipation and nausea. Negative for blood in stool, diarrhea and vomiting.  Genitourinary: Negative for dysuria, hematuria and urgency.  Musculoskeletal: Negative for myalgias.  Skin: Negative for rash.  Neurological: Negative for dizziness, weakness and light-headedness.    Physical Exam Updated Vital Signs  BP 111/79 (BP Location: Left Arm)   Pulse (!) 58   Temp 98.2 F (36.8 C) (Oral)   Resp 16   SpO2 99%   Physical Exam Vitals and nursing note reviewed.  Constitutional:      General: He is not in acute distress.    Appearance: He is well-developed.  HENT:     Head: Normocephalic and atraumatic.     Nose: Nose normal.  Eyes:     General: No scleral icterus.       Left eye: No discharge.     Conjunctiva/sclera: Conjunctivae normal.  Cardiovascular:     Rate and Rhythm: Normal rate and regular rhythm.      Heart sounds: Normal heart sounds. No murmur heard.  No friction rub. No gallop.   Pulmonary:     Effort: Pulmonary effort is normal. No respiratory distress.     Breath sounds: Normal breath sounds.  Chest:     Chest wall: Tenderness present.    Abdominal:     General: Bowel sounds are normal. There is no distension.     Palpations: Abdomen is soft.     Tenderness: There is no abdominal tenderness. There is no guarding.  Musculoskeletal:        General: Normal range of motion.     Cervical back: Normal range of motion and neck supple.  Skin:    General: Skin is warm and dry.     Findings: No rash.  Neurological:     Mental Status: He is alert.     Motor: No abnormal muscle tone.     Coordination: Coordination normal.     ED Results / Procedures / Treatments   Labs (all labs ordered are listed, but only abnormal results are displayed) Labs Reviewed  URINALYSIS, ROUTINE W REFLEX MICROSCOPIC - Abnormal; Notable for the following components:      Result Value   Color, Urine COLORLESS (*)    Specific Gravity, Urine 1.002 (*)    All other components within normal limits  HEPATIC FUNCTION PANEL    EKG None  Radiology DG Chest 2 View  Result Date: 05/11/2020 CLINICAL DATA:  Chest and left shoulder pain, constipation, COVID-19 positive 04/27/2020 EXAM: CHEST - 2 VIEW COMPARISON:  Radiograph 04/29/2020 FINDINGS: No consolidation, features of edema, pneumothorax, or effusion. Pulmonary vascularity is normally distributed. The cardiomediastinal contours are unremarkable. No acute osseous or soft tissue abnormality. IMPRESSION: No acute cardiopulmonary abnormality. Electronically Signed   By: Kreg Shropshire M.D.   On: 05/11/2020 17:57    Procedures Procedures (including critical care time)  Medications Ordered in ED Medications  sodium chloride 0.9 % bolus 1,000 mL (1,000 mLs Intravenous New Bag/Given 05/12/20 1101)  famotidine (PEPCID) IVPB 20 mg premix (20 mg Intravenous New  Bag/Given 05/12/20 1102)    ED Course  I have reviewed the triage vital signs and the nursing notes.  Pertinent labs & imaging results that were available during my care of the patient were reviewed by me and considered in my medical decision making (see chart for details).  Clinical Course as of May 12 1149  Tue May 12, 2020  1136 This is a 29 year old male w/ hx of chronic abdominal pain presenting to ED with abdominal pain.  He reports being diagnosed with COVID approx 3 weeks ago, after developing URI symptoms.  He has had intermittent abdominal pains since then.  He is concerned he is constipated now, with no sig BM in 2-3 days.  He  tried miralax today with little success.  He denies vomiting or nausea.  He reports he is eating well.  He says he has sharp intermittent abdominal pains all over his abdomen.  On exam he appears comfortable.  He has no abdominal ttp or rebound.  Negative murphy sign.  No ttp at mcburney's point.  Doubt this is appendicitis or perforation at this time.  Suspect this may be a gastritis with possibly pain due to constipation.  I would treat with mag citrate & dulcolax suppository for a clean out, then offer some pepcid and bentyl as well if he continues having symptoms.  He has seen GI in the past for similar issues - advised he f/u with them again ([facility]).    [MT]  1139 Of note, he was triaged yesterday and had xray of the chest performed with no acute findings.   [MT]    Clinical Course User Index [MT] Trifan, Kermit Balo, MD   MDM Rules/Calculators/A&P                          29 year old male presenting to the ED with a chief complaint of upper abdominal pain.  Had Covid 3 weeks ago with persistent abdominal discomfort.  Pain got worse last night after eating a bowl of Cheerios, he is unsure if the milk had gone bad.  Reports constipation for the past 3 days.  Reports nausea but denies vomiting.  No urinary symptoms, chest pain.  On exam he has no abdominal  tenderness although he does have tenderness of the bilateral lower chest wall.  He has no signs of respiratory distress and overall appears well.  Was seen yesterday but left prior to evaluation with reassuring work-up including lab work, 2 troponins and EKG.  Added on lipase and LFTs today are unremarkable.  Urinalysis is unremarkable.  Chest x-ray yesterday was unremarkable.  He does report constipation with the past 3 days.  Suspect constipation as the cause of his symptoms today with probable gastritis/reflux.  He is not hypoxic, tachycardic and has no pulmonary cause.  Will treat with mag citrate and Dulcolax suppositories.  He has a GI doctor who he will follow up.  Doubt appendicitis, cholecystitis, obstruction or cause of symptoms.  Patient is agreeable to the plan.  Return precautions given.  All imaging, if done today, including plain films, CT scans, and ultrasounds, independently reviewed by me, and interpretations confirmed via formal radiology reads.  Patient is hemodynamically stable, in NAD, and able to ambulate in the ED. Evaluation does not show pathology that would require ongoing emergent intervention or inpatient treatment. I explained the diagnosis to the patient. Pain has been managed and has no complaints prior to discharge. Patient is comfortable with above plan and is stable for discharge at this time. All questions were answered prior to disposition. Strict return precautions for returning to the ED were discussed. Encouraged follow up with PCP.   An After Visit Summary was printed and given to the patient.   Portions of this note were generated with Scientist, clinical (histocompatibility and immunogenetics). Dictation errors may occur despite best attempts at proofreading.  Final Clinical Impression(s) / ED Diagnoses Final diagnoses:  Slow transit constipation  Acute gastritis without hemorrhage, unspecified gastritis type    Rx / DC Orders ED Discharge Orders         Ordered    magnesium citrate SOLN    Once        05/12/20  1144    bisacodyl (DULCOLAX) 10 MG suppository  As needed        05/12/20 1144    famotidine (PEPCID) 20 MG tablet  2 times daily        05/12/20 1144    dicyclomine (BENTYL) 20 MG tablet  2 times daily        05/12/20 1144           Dietrich PatesKhatri, Shamon Lobo, PA-C 05/12/20 1150    Terald Sleeperrifan, Matthew J, MD 05/12/20 1820

## 2020-05-12 NOTE — Discharge Instructions (Signed)
Take magnesium citrate and use the Dulcolax suppository.  You can take the Pepcid and Bentyl as needed to help with your gastritis. Follow-up with your primary care provider and GI doctor. Return to the ER if you start to experience worsening pain, fever, chest pain or shortness of breath.

## 2020-05-12 NOTE — ED Triage Notes (Addendum)
Pt presents to ED POV. Pt c/o upper abd pain. Pt states that he woke up with burning pain and had eaten before bed. Seen earlier for CP and denies CP at this time

## 2020-05-18 ENCOUNTER — Other Ambulatory Visit: Payer: Self-pay | Admitting: Gastroenterology

## 2020-05-18 ENCOUNTER — Ambulatory Visit
Admission: RE | Admit: 2020-05-18 | Discharge: 2020-05-18 | Disposition: A | Discharge status: Home / Self Care | Payer: PRIVATE HEALTH INSURANCE | Source: Ambulatory Visit | Attending: Gastroenterology | Admitting: Gastroenterology

## 2020-05-18 DIAGNOSIS — K5900 Constipation, unspecified: Secondary | ICD-10-CM

## 2020-06-04 ENCOUNTER — Ambulatory Visit: Payer: Self-pay

## 2022-06-02 IMAGING — CR DG ABDOMEN 2V
2 series · 2 of 2 positions shown · non-contrast
Comparison: CT 09/18/2017

CLINICAL DATA: Constipation

EXAM:
ABDOMEN - 2 VIEW

[t abdomen supine]
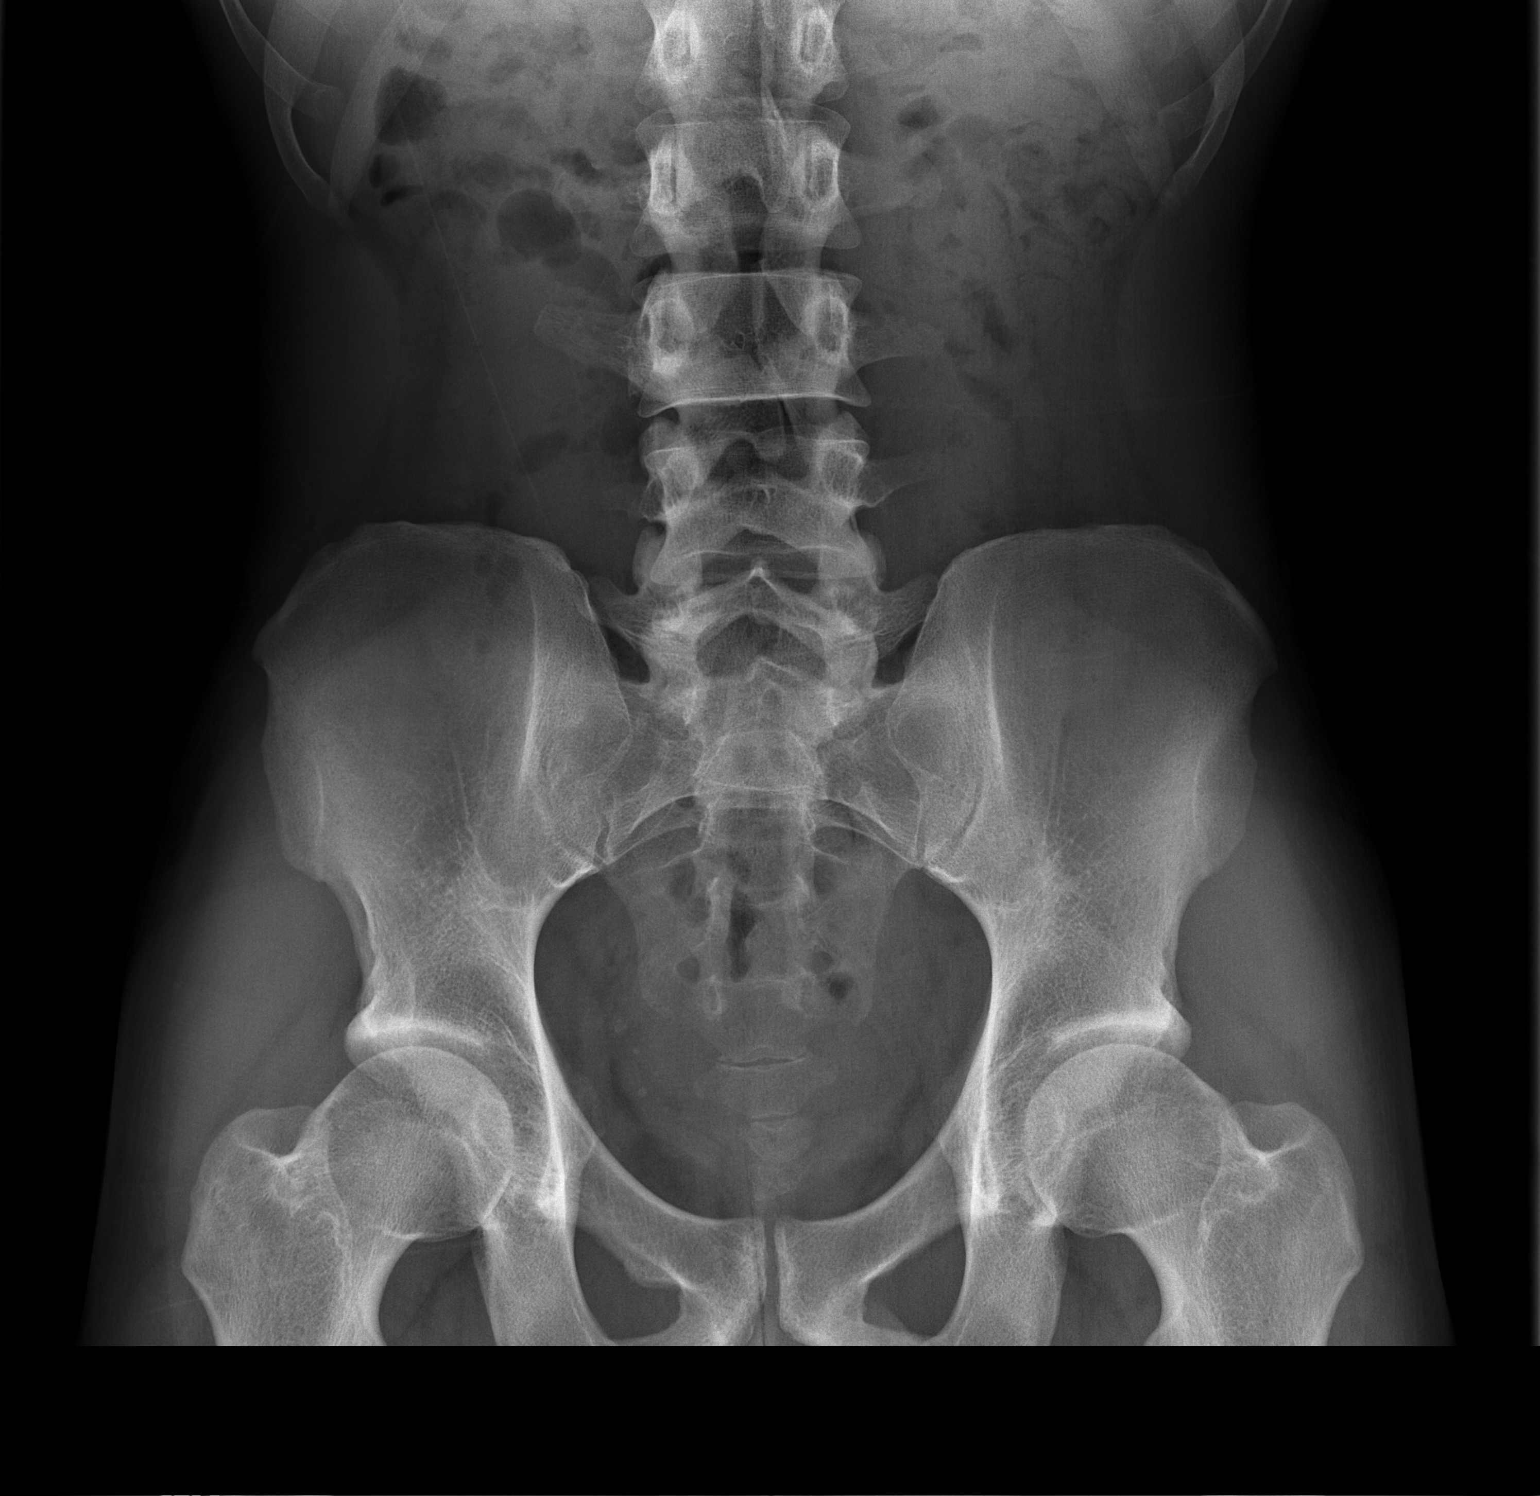

[w abdomen upright]
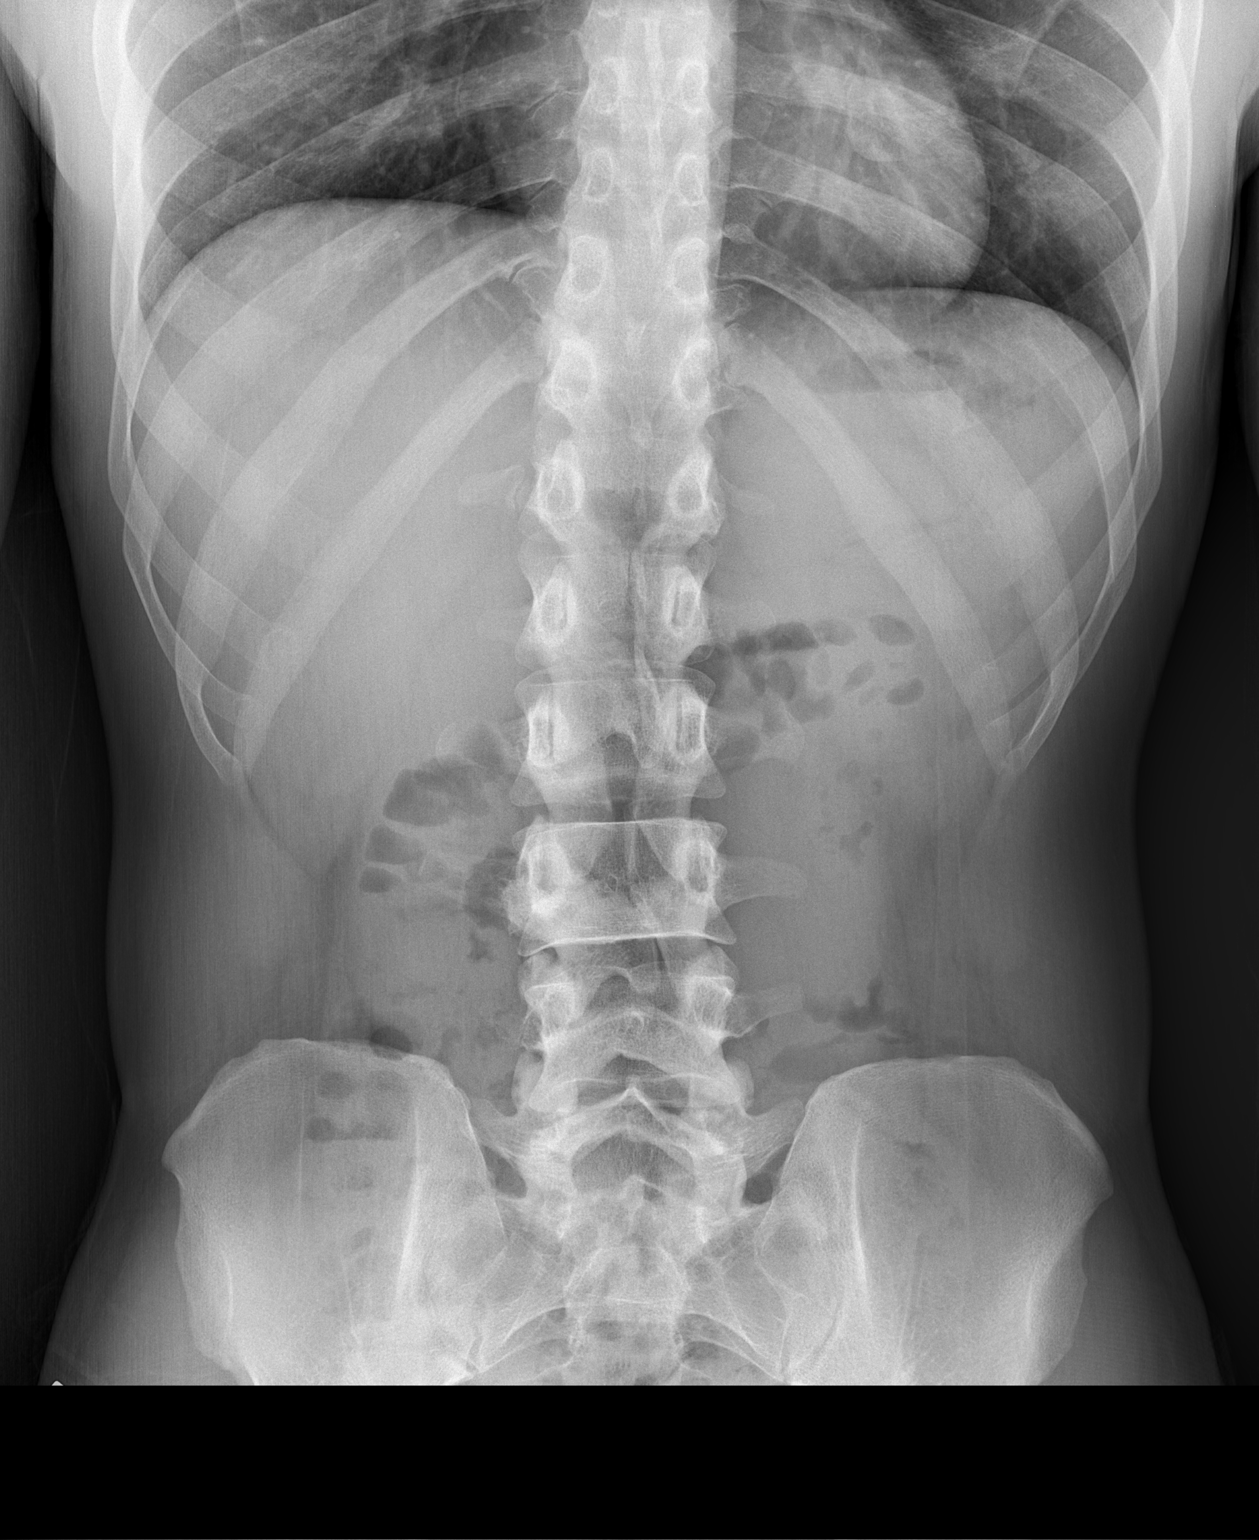

[2 of 2 positions shown; findings below may reference images not displayed]

FINDINGS: The bowel gas pattern is normal. There is no evidence of free air.
No radio-opaque calculi or other significant radiographic
abnormality is seen. Mild to moderate stool in the colon.
IMPRESSION: Negative.  Mild to moderate stool in the colon.
